# Patient Record
Sex: Male | Born: 1965 | Race: White | Hispanic: No | Marital: Married | Smoking: Former smoker
Health system: Southern US, Community
[De-identification: ages and names within clinical notes are randomized; demographics above are authoritative.]

## PROBLEM LIST (undated history)

## (undated) HISTORY — PX: BACK SURGERY: SHX140

---

## 2001-01-26 ENCOUNTER — Encounter: Payer: Self-pay | Admitting: Family Medicine

## 2001-01-26 ENCOUNTER — Ambulatory Visit (HOSPITAL_COMMUNITY): Admission: RE | Admit: 2001-01-26 | Discharge: 2001-01-26 | Payer: Self-pay | Admitting: Family Medicine

## 2001-03-06 ENCOUNTER — Ambulatory Visit (HOSPITAL_COMMUNITY): Admission: RE | Admit: 2001-03-06 | Discharge: 2001-03-06 | Payer: Self-pay | Admitting: Internal Medicine

## 2001-03-06 ENCOUNTER — Encounter: Payer: Self-pay | Admitting: Internal Medicine

## 2003-09-09 ENCOUNTER — Ambulatory Visit (HOSPITAL_COMMUNITY): Admission: RE | Admit: 2003-09-09 | Discharge: 2003-09-09 | Payer: Self-pay | Admitting: Family Medicine

## 2004-02-29 ENCOUNTER — Ambulatory Visit (HOSPITAL_COMMUNITY): Admission: RE | Admit: 2004-02-29 | Discharge: 2004-02-29 | Payer: Self-pay | Admitting: Family Medicine

## 2006-01-24 ENCOUNTER — Ambulatory Visit (HOSPITAL_COMMUNITY): Admission: RE | Admit: 2006-01-24 | Discharge: 2006-01-24 | Payer: Self-pay | Admitting: Family Medicine

## 2008-06-30 ENCOUNTER — Ambulatory Visit (HOSPITAL_COMMUNITY): Admission: RE | Admit: 2008-06-30 | Discharge: 2008-07-01 | Payer: Self-pay | Admitting: Neurological Surgery

## 2010-04-22 ENCOUNTER — Encounter: Payer: Self-pay | Admitting: Family Medicine

## 2010-07-11 LAB — BASIC METABOLIC PANEL
BUN: 16 mg/dL (ref 6–23)
Calcium: 8.9 mg/dL (ref 8.4–10.5)
Chloride: 105 mEq/L (ref 96–112)
GFR calc non Af Amer: >60 mL/min (ref >60)
Potassium: 4.2 mEq/L (ref 3.5–5.1)

## 2010-07-11 LAB — CBC
HCT: 48 % (ref 39.0–52.0)
MCHC: 35 g/dL (ref 30.0–36.0)
MCV: 93.1 fL (ref 78.0–100.0)
RBC: 5.16 MIL/uL (ref 4.22–5.81)
RDW: 12.9 % (ref 11.5–15.5)

## 2010-08-14 NOTE — Op Note (Signed)
NAME:  Ryan Montoya, Ryan Montoya               ACCOUNT NO.:  [id number]   MEDICAL RECORD NO.:  [id number]          PATIENT TYPE:  OIB   LOCATION:  3534                         FACILITY:  [facility]   PHYSICIAN:  Stefani Dama, M.D.  DATE OF BIRTH:  03-01-1966   DATE OF PROCEDURE:  06/30/2008  DATE OF DISCHARGE:                               OPERATIVE REPORT   PREOPERATIVE DIAGNOSIS:  Herniated nucleus pulposus, L4-L5 left with  left lumbar radiculopathy.   POSTOPERATIVE DIAGNOSIS:  Herniated nucleus pulposus, L4-L5 left with  left lumbar radiculopathy.   PROCEDURE:  Microdiskectomy, L4-L5 left with operating microscope  microdissection technique.   SURGEON:  Stefani Dama, MD   ANESTHESIA:  General endotracheal.   INDICATIONS:  Ryan Montoya is a 45 year old individual who has had  significant back and left lower extremity pain.  He has disk herniation  at L4-L5 on the left side and he has been advised regarding surgical  decompression.  He has some evidence of some modest weakness in the  tibialis anterior group.   PROCEDURE IN DETAIL:  The patient was brought to the operating room  supine on a stretcher.  After smooth induction of general endotracheal  anesthesia, he was turned prone.  The back was prepped with alcohol and  DuraPrep and draped in a sterile fashion.  A small vertical incision was  created in the midline of the lumbar spine across the region of the  posterior-superior iliac crest.  The dissection was carried down to the  lumbodorsal fascia which was opened on left side of midline.  Dissection  of the first interlaminar space revealed L5-S1 on a radiograph and  dissection was carried slightly cephalad to expose L4-L5.  The inferior  margin of the lamina of L5 including the medial wall of the facet was  then taken down with a high-speed drill and a 2.3-mm dissecting tool.  The superior articular process of L5 was resected partially and this  identified the area of the disk  space and the underlying dura which was  compressed dorsally stretching the L5 nerve root as it reached for the  L5 foramen and underneath this, was noted to be a significant mass with  a pearlescent thinned out piece of ligament and dissection through this  yielded a singular large fragment of disk, which when removed yielded  good relief on the L5 nerve root.  The space was explored and it was  noted be contiguous with the disk space and exposure of this yielded  substantially more markedly degenerated disk material which was removed  in a piecemeal fashion using combination of curettes, rongeurs, and  punches.  As the dissection continued from both medially and laterally,  significant quantities of disk material were removed estimating the  total at about 40% of volume of the disk space.  In the end, good  decompression of the common dural tube and the nerve root was achieved.  The disk space was emptied of all available loose fragments of disk that  could be reached via this technique.  Once this was accomplished, then  the area was  irrigated copiously with antibiotic irrigating solution.  Epidural veins were checked for hemostasis.  Once this was achieved, 1  mL of fentanyl was left in the epidural space and the disk space.  Then  the lumbodorsal fascia  was closed with #1 Vicryl, #1 Vicryl was used for subcutaneous tissues,  and 3-0 Vicryl was used in the subcuticular skin.  The patient tolerated  the procedure well and was returned to the recovery room in stable  condition.  Dermabond was placed on the skin prior to returning the  patient.      Stefani Dama, M.D.  Electronically Signed     HJE/MEDQ  D:  06/30/2008  T:  07/01/2008  Job:  [id number]

## 2013-12-22 ENCOUNTER — Other Ambulatory Visit (HOSPITAL_COMMUNITY): Payer: Self-pay | Admitting: Physician Assistant

## 2013-12-22 DIAGNOSIS — R609 Edema, unspecified: Secondary | ICD-10-CM

## 2013-12-23 ENCOUNTER — Ambulatory Visit (HOSPITAL_COMMUNITY)
Admission: RE | Admit: 2013-12-23 | Discharge: 2013-12-23 | Disposition: A | Discharge status: Home / Self Care | Payer: Managed Care, Other (non HMO) | Source: Ambulatory Visit | Attending: Physician Assistant | Admitting: Physician Assistant

## 2013-12-23 DIAGNOSIS — R609 Edema, unspecified: Secondary | ICD-10-CM

## 2013-12-23 DIAGNOSIS — F17200 Nicotine dependence, unspecified, uncomplicated: Secondary | ICD-10-CM | POA: Diagnosis not present

## 2013-12-28 ENCOUNTER — Encounter (INDEPENDENT_AMBULATORY_CARE_PROVIDER_SITE_OTHER): Payer: Self-pay | Admitting: *Deleted

## 2014-02-08 ENCOUNTER — Other Ambulatory Visit: Payer: Self-pay

## 2014-02-08 ENCOUNTER — Telehealth: Payer: Self-pay

## 2014-02-08 DIAGNOSIS — Z1211 Encounter for screening for malignant neoplasm of colon: Secondary | ICD-10-CM

## 2014-02-08 NOTE — Telephone Encounter (Signed)
Pt's wife left a VM to call to schedule screening colonoscopy. He was referred by Dr. Hilma Favors. I returned her call and Left Vm for a return call.

## 2014-02-09 NOTE — Telephone Encounter (Signed)
Gastroenterology Pre-Procedure Review  Request Date: 02/08/2014 Requesting Physician: Dr. Hilma Favors  PATIENT REVIEW QUESTIONS: The patient responded to the following health history questions as indicated:    1. Diabetes Melitis: no 2. Joint replacements in the past 12 months: no 3. Major health problems in the past 3 months: no 4. Has an artificial valve or MVP: no 5. Has a defibrillator: no 6. Has been advised in past to take antibiotics in advance of a procedure like teeth cleaning: no    MEDICATIONS & ALLERGIES:    Patient reports the following regarding taking any blood thinners:   Plavix? no Aspirin? no Coumadin? no  Patient confirms/reports the following medications:  Current Outpatient Prescriptions  Medication Sig Dispense Refill  . ALPRAZolam (XANAX) 0.5 MG tablet Take 0.5 mg by mouth 2 (two) times daily as needed for anxiety.    Marland Kitchen amLODipine (NORVASC) 5 MG tablet Take 5 mg by mouth daily.    Marland Kitchen buPROPion (WELLBUTRIN) 100 MG tablet Take 100 mg by mouth 2 (two) times daily.    . lansoprazole (PREVACID) 30 MG capsule Take 30 mg by mouth daily.    Marland Kitchen latanoprost (XALATAN) 0.005 % ophthalmic solution 1 drop at bedtime.     No current facility-administered medications for this visit.    Patient confirms/reports the following allergies:  Not on File  No orders of the defined types were placed in this encounter.    AUTHORIZATION INFORMATION Primary Insurance:   ID #:   Group #:  Pre-Cert / Auth required:  Pre-Cert / Auth #:   Secondary Insurance:   ID #:   Group #:  Pre-Cert / Auth required: Pre-Cert / Auth #:   SCHEDULE INFORMATION: Procedure has been scheduled as follows:  Date:03/18/2014               Time: 8:30 AM Location: [facility] Short Stay  This Gastroenterology Pre-Precedure Review Form is being routed to the following provider(s): Barney Drain, MD

## 2014-02-16 MED ORDER — PEG-KCL-NACL-NASULF-NA ASC-C 100 G PO SOLR: 1.0000 | ORAL | Status: DC

## 2014-02-16 NOTE — Telephone Encounter (Signed)
MOVI PREP SPLIT DOSING, REGULAR BREAKFAST. CLEAR LIQUIDS AFTER 9 AM.  WILL NEED PHENERGAN FOR CONSCIOUS SEDATION.

## 2014-02-16 NOTE — Telephone Encounter (Signed)
Rx sent to the pharmacy and instructions mailed to pt.  

## 2014-02-17 NOTE — Telephone Encounter (Signed)
Carolyn in endo is aware that pt will need phenergan on hand and she is putting note in orders.

## 2014-03-07 ENCOUNTER — Telehealth: Payer: Self-pay

## 2014-03-07 NOTE — Telephone Encounter (Signed)
REVIEWED-NO ADDITIONAL RECOMMENDATIONS. 

## 2014-03-07 NOTE — Telephone Encounter (Signed)
I called pt on Friday, 03/04/2014 to update meds prior to colonoscopy and he said there has been no change.

## 2014-03-11 ENCOUNTER — Telehealth: Payer: Self-pay

## 2014-03-11 NOTE — Telephone Encounter (Signed)
I called [employer] and spoke to [place] who said that a PA is not required for screening colonoscopy.

## 2014-03-18 ENCOUNTER — Encounter (HOSPITAL_COMMUNITY)
Admission: RE | Disposition: A | Discharge status: Home / Self Care | Payer: Self-pay | Source: Ambulatory Visit | Attending: Gastroenterology

## 2014-03-18 ENCOUNTER — Ambulatory Visit (HOSPITAL_COMMUNITY)
Admission: RE | Admit: 2014-03-18 | Discharge: 2014-03-18 | Disposition: A | Discharge status: Home / Self Care | Payer: 59 | Source: Ambulatory Visit | Attending: Gastroenterology | Admitting: Gastroenterology

## 2014-03-18 ENCOUNTER — Encounter (HOSPITAL_COMMUNITY): Payer: Self-pay | Admitting: *Deleted

## 2014-03-18 DIAGNOSIS — K621 Rectal polyp: Secondary | ICD-10-CM | POA: Diagnosis not present

## 2014-03-18 DIAGNOSIS — Z1211 Encounter for screening for malignant neoplasm of colon: Secondary | ICD-10-CM | POA: Insufficient documentation

## 2014-03-18 DIAGNOSIS — D122 Benign neoplasm of ascending colon: Secondary | ICD-10-CM

## 2014-03-18 DIAGNOSIS — K5730 Diverticulosis of large intestine without perforation or abscess without bleeding: Secondary | ICD-10-CM | POA: Insufficient documentation

## 2014-03-18 DIAGNOSIS — K648 Other hemorrhoids: Secondary | ICD-10-CM | POA: Insufficient documentation

## 2014-03-18 DIAGNOSIS — Z87891 Personal history of nicotine dependence: Secondary | ICD-10-CM | POA: Diagnosis not present

## 2014-03-18 HISTORY — PX: COLONOSCOPY: SHX5424

## 2014-03-18 SURGERY — COLONOSCOPY
Anesthesia: Moderate Sedation

## 2014-03-18 MED ORDER — MIDAZOLAM HCL 5 MG/5ML IJ SOLN
INTRAMUSCULAR | Status: AC
  Filled 2014-03-18: qty 10

## 2014-03-18 MED ORDER — PROMETHAZINE HCL 25 MG/ML IJ SOLN
INTRAMUSCULAR | Status: DC | PRN
  Administered 2014-03-18 (×2): 12.5 mg via INTRAVENOUS

## 2014-03-18 MED ORDER — PROMETHAZINE HCL 25 MG/ML IJ SOLN
INTRAMUSCULAR | Status: AC
  Filled 2014-03-18: qty 1

## 2014-03-18 MED ORDER — SIMETHICONE 40 MG/0.6ML PO SUSP
ORAL | Status: DC | PRN
  Administered 2014-03-18: 09:00:00

## 2014-03-18 MED ORDER — SODIUM CHLORIDE 0.9 % IJ SOLN
INTRAMUSCULAR | Status: AC
  Filled 2014-03-18: qty 10

## 2014-03-18 MED ORDER — MIDAZOLAM HCL 5 MG/5ML IJ SOLN
INTRAMUSCULAR | Status: DC | PRN
  Administered 2014-03-18 (×2): 2 mg via INTRAVENOUS
  Administered 2014-03-18: 1 mg via INTRAVENOUS
  Administered 2014-03-18: 2 mg via INTRAVENOUS

## 2014-03-18 MED ORDER — MEPERIDINE HCL 100 MG/ML IJ SOLN
INTRAMUSCULAR | Status: AC
  Filled 2014-03-18: qty 2

## 2014-03-18 MED ORDER — MEPERIDINE HCL 100 MG/ML IJ SOLN
INTRAMUSCULAR | Status: DC | PRN
  Administered 2014-03-18: 50 mg via INTRAVENOUS
  Administered 2014-03-18: 25 mg via INTRAVENOUS
  Administered 2014-03-18: 50 mg via INTRAVENOUS

## 2014-03-18 NOTE — Op Note (Signed)
[name], [address]   COLONOSCOPY PROCEDURE REPORT  PATIENT: Ryan Montoya, Ryan Montoya  MR#: [id number] BIRTHDATE: 1965-09-07 , 48  yrs. old GENDER: male ENDOSCOPIST: Barney Drain, MD REFERRED BY: PROCEDURE DATE:  April 04, 2014 PROCEDURE:   Colonoscopy with snare polypectomy and Colonoscopy with cold biopsy polypectomy INDICATIONS:average risk for colon cancer. MEDICATIONS: Demerol 125 mg IV, Promethazine (Phenergan) 25 mg IV, and Versed 7 mg IV  DESCRIPTION OF PROCEDURE:    Physical exam was performed.  Informed consent was obtained from the patient after explaining the benefits, risks, and alternatives to procedure.  The patient was connected to monitor and placed in left lateral position. Continuous oxygen was provided by nasal cannula and IV medicine administered through an indwelling cannula.  After administration of sedation and rectal exam, the patients rectum was intubated and the EC-3890Li ([id number])  colonoscope was advanced under direct visualization to the cecum.  The scope was removed slowly by carefully examining the color, texture, anatomy, and integrity mucosa on the way out.  The patient was recovered in endoscopy and discharged home in satisfactory condition.    COLON FINDINGS: A sessile polyp measuring 6 mm in size was found in the ascending colon.  A polypectomy was performed using snare cautery.  , A sessile polyp measuring 3 mm in size was found in the rectum.  A polypectomy was performed with cold forceps.  , There was moderate diverticulosis noted throughout the entire examined colon with associated muscular hypertrophy.  , and Moderate sized internal hemorrhoids were found.  PREP QUALITY: good.  CECAL W/D TIME: 37.1  MINS   COMPLICATIONS: None  ENDOSCOPIC IMPRESSION: 1.   TWO COLON POLYPS REMOVED 2.   Moderate diverticulosis throughout the entire examined colon 3.   Moderate sized internal  hemorrhoids  RECOMMENDATIONS: FOLLOW A HIGH FIBER DIET.  AVOID ITEMS THAT CAUSE BLOATING & GAS. AWAIT BIOPSY Next colonoscopy in 5-10 years.    _______________________________ Lorrin MaisBarney Drain, MD Apr 04, 2014 1:15 PM    CPT CODES: ICD CODES:  The ICD and CPT codes recommended by this software are interpretations from the data that the clinical staff has captured with the software.  The verification of the translation of this report to the ICD and CPT codes and modifiers is the sole responsibility of the health care institution and practicing physician where this report was generated.  [employer] will not be held responsible for the validity of the ICD and CPT codes included on this report.  AMA assumes no liability for data contained or not contained herein. CPT is a Designer, television/film set of the [employer].

## 2014-03-18 NOTE — Discharge Instructions (Signed)
You had 2 polyps removed. You have internal hemorrhoids. YOU HAVE DIVERTICULOSIS IN YOUR RIGHT AND LEFT COLON.   FOLLOW A HIGH FIBER DIET. AVOID ITEMS THAT CAUSE BLOATING & GAS. SEE INFO BELOW.  YOUR BIOPSY WILL BE BACK IN 14 DAYS OR YOU CAN LOOK THEM UP ON MY CHART AFTER DEC 22.  Next colonoscopy in 5-10 years.   Colonoscopy Care After Read the instructions outlined below and refer to this sheet in the next week. These discharge instructions provide you with general information on caring for yourself after you leave the hospital. While your treatment has been planned according to the most current medical practices available, unavoidable complications occasionally occur. If you have any problems or questions after discharge, call DR. Jaykob Minichiello, [phone].  ACTIVITY  You may resume your regular activity, but move at a slower pace for the next 24 hours.   Take frequent rest periods for the next 24 hours.   Walking will help get rid of the air and reduce the bloated feeling in your belly (abdomen).   No driving for 24 hours (because of the medicine (anesthesia) used during the test).   You may shower.   Do not sign any important legal documents or operate any machinery for 24 hours (because of the anesthesia used during the test).    NUTRITION  Drink plenty of fluids.   You may resume your normal diet as instructed by your doctor.   Begin with a light meal and progress to your normal diet. Heavy or fried foods are harder to digest and may make you feel sick to your stomach (nauseated).   Avoid alcoholic beverages for 24 hours or as instructed.    MEDICATIONS  You may resume your normal medications.   WHAT YOU CAN EXPECT TODAY  Some feelings of bloating in the abdomen.   Passage of more gas than usual.   Spotting of blood in your stool or on the toilet paper  .  IF YOU HAD POLYPS REMOVED DURING THE COLONOSCOPY:  Eat a soft diet IF YOU HAVE NAUSEA, BLOATING,  ABDOMINAL PAIN, OR VOMITING.    FINDING OUT THE RESULTS OF YOUR TEST Not all test results are available during your visit. DR. Oneida Alar WILL CALL YOU WITHIN 14 DAYS OF YOUR PROCEDUE WITH YOUR RESULTS. Do not assume everything is normal if you have not heard from DR. Princessa Lesmeister, CALL HER OFFICE AT [phone].  SEEK IMMEDIATE MEDICAL ATTENTION AND CALL THE OFFICE: [phone] IF:  You have more than a spotting of blood in your stool.   Your belly is swollen (abdominal distention).   You are nauseated or vomiting.   You have a temperature over 101F.   You have abdominal pain or discomfort that is severe or gets worse throughout the day.   High-Fiber Diet A high-fiber diet changes your normal diet to include more whole grains, legumes, fruits, and vegetables. Changes in the diet involve replacing refined carbohydrates with unrefined foods. The calorie level of the diet is essentially unchanged. The Dietary Reference Intake (recommended amount) for adult males is 38 grams per day. For adult females, it is 25 grams per day. Pregnant and lactating women should consume 28 grams of fiber per day. Fiber is the intact part of a plant that is not broken down during digestion. Functional fiber is fiber that has been isolated from the plant to provide a beneficial effect in the body. PURPOSE  Increase stool bulk.   Ease and regulate bowel movements.   Lower  cholesterol.  INDICATIONS THAT YOU NEED MORE FIBER  Constipation and hemorrhoids.   Uncomplicated diverticulosis (intestine condition) and irritable bowel syndrome.   Weight management.   As a protective measure against hardening of the arteries (atherosclerosis), diabetes, and cancer.   GUIDELINES FOR INCREASING FIBER IN THE DIET  Start adding fiber to the diet slowly. A gradual increase of about 5 more grams (2 slices of whole-wheat bread, 2 servings of most fruits or vegetables, or 1 bowl of high-fiber cereal) per day is best. Too rapid  an increase in fiber may result in constipation, flatulence, and bloating.   Drink enough water and fluids to keep your urine clear or pale yellow. Water, juice, or caffeine-free drinks are recommended. Not drinking enough fluid may cause constipation.   Eat a variety of high-fiber foods rather than one type of fiber.   Try to increase your intake of fiber through using high-fiber foods rather than fiber pills or supplements that contain small amounts of fiber.   The goal is to change the types of food eaten. Do not supplement your present diet with high-fiber foods, but replace foods in your present diet.  INCLUDE A VARIETY OF FIBER SOURCES  Replace refined and processed grains with whole grains, canned fruits with fresh fruits, and incorporate other fiber sources. White rice, white breads, and most bakery goods contain little or no fiber.   Brown whole-grain rice, buckwheat oats, and many fruits and vegetables are all good sources of fiber. These include: broccoli, Brussels sprouts, cabbage, cauliflower, beets, sweet potatoes, white potatoes (skin on), carrots, tomatoes, eggplant, squash, berries, fresh fruits, and dried fruits.   Cereals appear to be the richest source of fiber. Cereal fiber is found in whole grains and bran. Bran is the fiber-rich outer coat of cereal grain, which is largely removed in refining. In whole-grain cereals, the bran remains. In breakfast cereals, the largest amount of fiber is found in those with "bran" in their names. The fiber content is sometimes indicated on the label.   You may need to include additional fruits and vegetables each day.   In baking, for 1 cup white flour, you may use the following substitutions:   1 cup whole-wheat flour minus 2 tablespoons.   1/2 cup white flour plus 1/2 cup whole-wheat flour.   Diverticulosis Diverticulosis is a common condition that develops when small pouches (diverticula) form in the wall of the colon. The risk of  diverticulosis increases with age. It happens more often in people who eat a low-fiber diet. Most individuals with diverticulosis have no symptoms. Those individuals with symptoms usually experience belly (abdominal) pain, constipation, or loose stools (diarrhea).  HOME CARE INSTRUCTIONS  Increase the amount of fiber in your diet as directed by your caregiver or dietician. This may reduce symptoms of diverticulosis.   Drink at least 6 to 8 glasses of water each day to prevent constipation.   Try not to strain when you have a bowel movement.   NO NEED TO AVOID nuts and seeds.   FOODS HAVING HIGH FIBER CONTENT INCLUDE:  Fruits. Apple, peach, pear, tangerine, raisins, prunes.   Vegetables. Brussels sprouts, asparagus, broccoli, cabbage, carrot, cauliflower, romaine lettuce, spinach, summer squash, tomato, winter squash, zucchini.   Starchy Vegetables. Baked beans, kidney beans, lima beans, split peas, lentils, potatoes (with skin).   Grains. Whole wheat bread, brown rice, bran flake cereal, plain oatmeal, white rice, shredded wheat, bran muffins.    SEEK IMMEDIATE MEDICAL CARE IF:  You develop increasing  pain or severe bloating.   You have an oral temperature above 101F.   You develop vomiting or bowel movements that are bloody or black.    Polyps, Colon  A polyp is extra tissue that grows inside your body. Colon polyps grow in the large intestine. The large intestine, also called the colon, is part of your digestive system. It is a long, hollow tube at the end of your digestive tract where your body makes and stores stool. Most polyps are not dangerous. They are benign. This means they are not cancerous. But over time, some types of polyps can turn into cancer. Polyps that are smaller than a pea are usually not harmful. But larger polyps could someday become or may already be cancerous. To be safe, doctors remove all polyps and test them.   WHO GETS POLYPS? Anyone can get polyps,  but certain people are more likely than others. You may have a greater chance of getting polyps if:  You are over 50.   You have had polyps before.   Someone in your family has had polyps.   Someone in your family has had cancer of the large intestine.   Find out if someone in your family has had polyps. You may also be more likely to get polyps if you:   Eat a lot of fatty foods   Smoke   Drink alcohol   Do not exercise  Eat too much   TREATMENT  The caregiver will remove the polyp during sigmoidoscopy or colonoscopy.    PREVENTION There is not one sure way to prevent polyps. You might be able to lower your risk of getting them if you:  Eat more fruits and vegetables and less fatty food.   Do not smoke.   Avoid alcohol.   Exercise every day.   Lose weight if you are overweight.   Eating more calcium and folate can also lower your risk of getting polyps. Some foods that are rich in calcium are milk, cheese, and broccoli. Some foods that are rich in folate are chickpeas, kidney beans, and spinach.   Hemorrhoids Hemorrhoids are dilated (enlarged) veins around the rectum. Sometimes clots will form in the veins. This makes them swollen and painful. These are called thrombosed hemorrhoids. Causes of hemorrhoids include:  Constipation.   Straining to have a bowel movement.   HEAVY LIFTING HOME CARE INSTRUCTIONS  Eat a well balanced diet and drink 6 to 8 glasses of water every day to avoid constipation. You may also use a bulk laxative.   Avoid straining to have bowel movements.   Keep anal area dry and clean.   Do not use a donut shaped pillow or sit on the toilet for long periods. This increases blood pooling and pain.   Move your bowels when your body has the urge; this will require less straining and will decrease pain and pressure.

## 2014-03-18 NOTE — H&P (Signed)
  Primary Care Physician:  Collene Mares, PA-C Primary Gastroenterologist:  Dr. Oneida Alar  Pre-Procedure History & Physical: HPI:  Ryan Montoya is a 48 y.o. male here for [place].  History reviewed. No pertinent past medical history.  Past Surgical History  Procedure Laterality Date  . Back surgery      Prior to Admission medications   Medication Sig Start Date End Date Taking? Authorizing Provider  ALPRAZolam Duanne Moron) 0.5 MG tablet Take 0.5 mg by mouth 2 (two) times daily as needed for anxiety.   Yes Historical Provider, MD  amLODipine (NORVASC) 5 MG tablet Take 5 mg by mouth daily.   Yes Historical Provider, MD  buPROPion (WELLBUTRIN) 100 MG tablet Take 100 mg by mouth 2 (two) times daily.   Yes Historical Provider, MD  lansoprazole (PREVACID) 30 MG capsule Take 30 mg by mouth daily.   Yes Historical Provider, MD  latanoprost (XALATAN) 0.005 % ophthalmic solution 1 drop at bedtime.   Yes Historical Provider, MD  peg 3350 powder (MOVIPREP) 100 G SOLR Take 1 kit (200 g total) by mouth as directed. 02/16/14  Yes Danie Binder, MD    Allergies as of 02/08/2014  . (Not on File)    Family History  Problem Relation Age of Onset  . Heart attack Mother   . Colon cancer Mother     History   Social History  . Marital Status: Married    Spouse Name: N/A    Number of Children: N/A  . Years of Education: N/A   Occupational History  . Not on file.   Social History Main Topics  . Smoking status: Former Smoker -- 1.50 packs/day for 5 years    Quit date: 03/19/1999  . Smokeless tobacco: Never Used  . Alcohol Use: No  . Drug Use: No  . Sexual Activity: Yes   Other Topics Concern  . Not on file   Social History Narrative  . No narrative on file    Review of Systems: See HPI, otherwise negative ROS   Physical Exam: BP 158/100 mmHg  Pulse 84  Temp(Src) 97.9 F (36.6 C) (Oral)  Resp 16  Ht _0  (1.702 m)  Wt 186 lb (84.369 kg)  BMI 29.12 kg/m2  SpO2  98% General:   Alert,  pleasant and cooperative in NAD Head:  Normocephalic and atraumatic. Neck:  Supple; Lungs:  Clear throughout to auscultation.    Heart:  Regular rate and rhythm. Abdomen:  Soft, nontender and nondistended. Normal bowel sounds, without guarding, and without rebound.   Neurologic:  Alert and  oriented x4;  grossly normal neurologically.  Impression/Plan:     SCREENING  Plan:  1. TCS TODAY

## 2014-03-21 ENCOUNTER — Encounter (HOSPITAL_COMMUNITY): Payer: Self-pay | Admitting: Gastroenterology

## 2014-05-04 ENCOUNTER — Telehealth: Payer: Self-pay | Admitting: Gastroenterology

## 2014-05-04 NOTE — Telephone Encounter (Signed)
Please call pt. He had ONE simple adenoma removed. THE OTHER WAS A BENIGN POLYPOID LESION.    FOLLOW A HIGH FIBER DIET. AVOID ITEMS THAT CAUSE BLOATING & GAS.  Next colonoscopy in 5-10 years WITH PROPOFOL BECAUSE IT TOOK HIGH DOSES OF MEDICATION TO GET HIM SLEEPY.

## 2014-05-04 NOTE — Telephone Encounter (Signed)
LM for pt to call

## 2014-05-04 NOTE — Telephone Encounter (Signed)
REMINDER IN EPIC °

## 2014-05-05 ENCOUNTER — Telehealth: Payer: Self-pay | Admitting: Gastroenterology

## 2014-05-05 NOTE — Telephone Encounter (Signed)
Pt returned call and was informed.  

## 2014-05-05 NOTE — Telephone Encounter (Signed)
Patient returned call and will call back because he is at work and difficult for him to get to the phone.

## 2014-05-05 NOTE — Telephone Encounter (Signed)
Pt to call back, he is at work.

## 2014-05-05 NOTE — Telephone Encounter (Signed)
Noted  

## 2014-05-13 ENCOUNTER — Ambulatory Visit (HOSPITAL_COMMUNITY)
Admission: RE | Admit: 2014-05-13 | Discharge: 2014-05-13 | Disposition: A | Discharge status: Home / Self Care | Payer: Managed Care, Other (non HMO) | Source: Ambulatory Visit | Attending: Internal Medicine | Admitting: Internal Medicine

## 2014-05-13 ENCOUNTER — Other Ambulatory Visit (HOSPITAL_COMMUNITY): Payer: Self-pay | Admitting: Internal Medicine

## 2014-05-13 DIAGNOSIS — R059 Cough, unspecified: Secondary | ICD-10-CM

## 2014-05-13 DIAGNOSIS — R918 Other nonspecific abnormal finding of lung field: Secondary | ICD-10-CM | POA: Insufficient documentation

## 2014-05-13 DIAGNOSIS — R05 Cough: Secondary | ICD-10-CM | POA: Diagnosis present

## 2018-10-19 ENCOUNTER — Other Ambulatory Visit: Payer: Self-pay

## 2018-10-19 ENCOUNTER — Ambulatory Visit (HOSPITAL_COMMUNITY)
Admission: RE | Admit: 2018-10-19 | Discharge: 2018-10-19 | Disposition: A | Discharge status: Home / Self Care | Payer: Managed Care, Other (non HMO) | Source: Ambulatory Visit | Attending: Internal Medicine | Admitting: Internal Medicine

## 2018-10-19 ENCOUNTER — Other Ambulatory Visit (HOSPITAL_COMMUNITY): Payer: Self-pay | Admitting: Internal Medicine

## 2018-10-19 DIAGNOSIS — M79671 Pain in right foot: Secondary | ICD-10-CM

## 2019-03-16 ENCOUNTER — Encounter: Payer: Self-pay | Admitting: Gastroenterology

## 2020-04-29 IMAGING — DX RIGHT FOOT COMPLETE - 3+ VIEW
3 series · 3 of 3 positions shown · non-contrast
Comparison: None.

CLINICAL DATA: Right foot pain for the past 4 months with plantar
soft tissue swelling. No known injury.

EXAM:
RIGHT FOOT COMPLETE - 3+ VIEW

[foot ap]
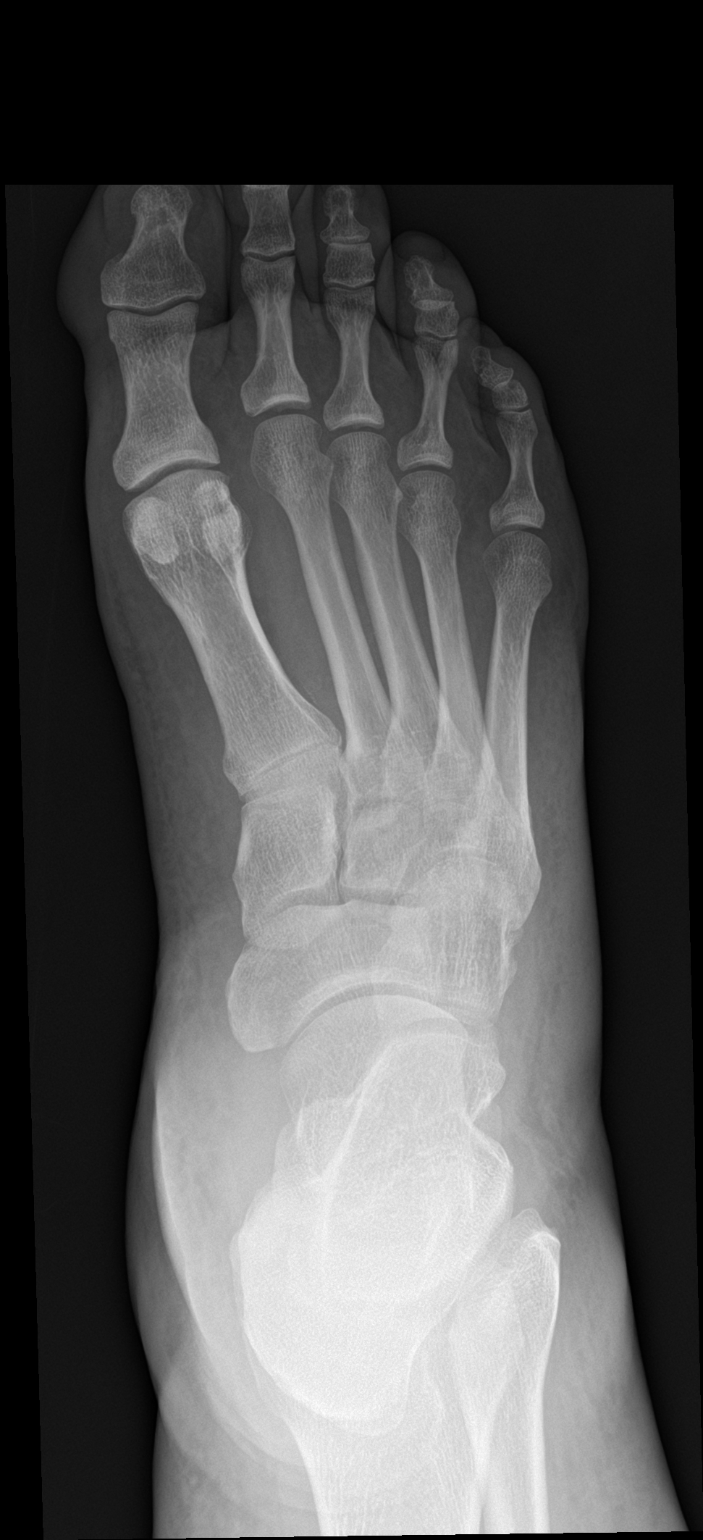

[foot obl]
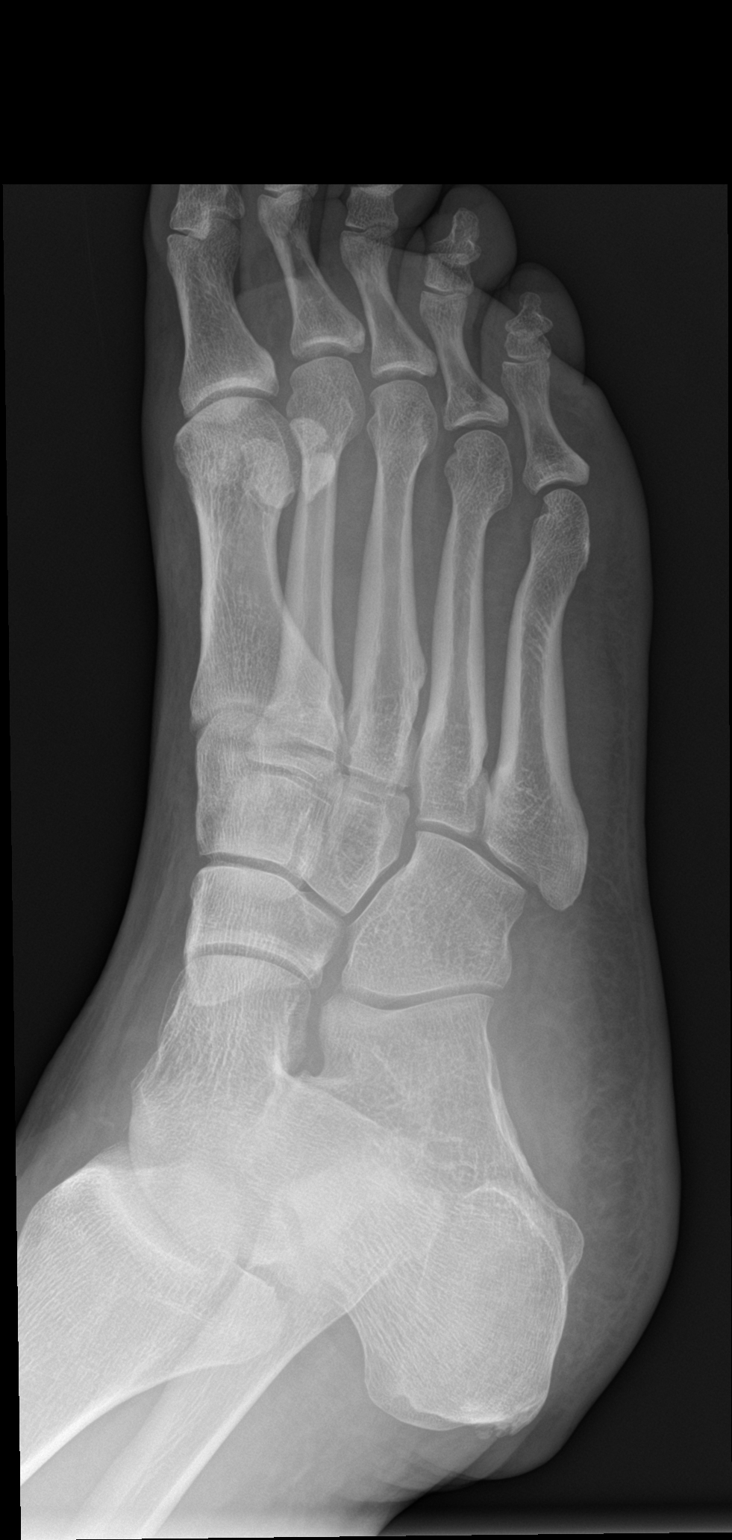

[foot lat]
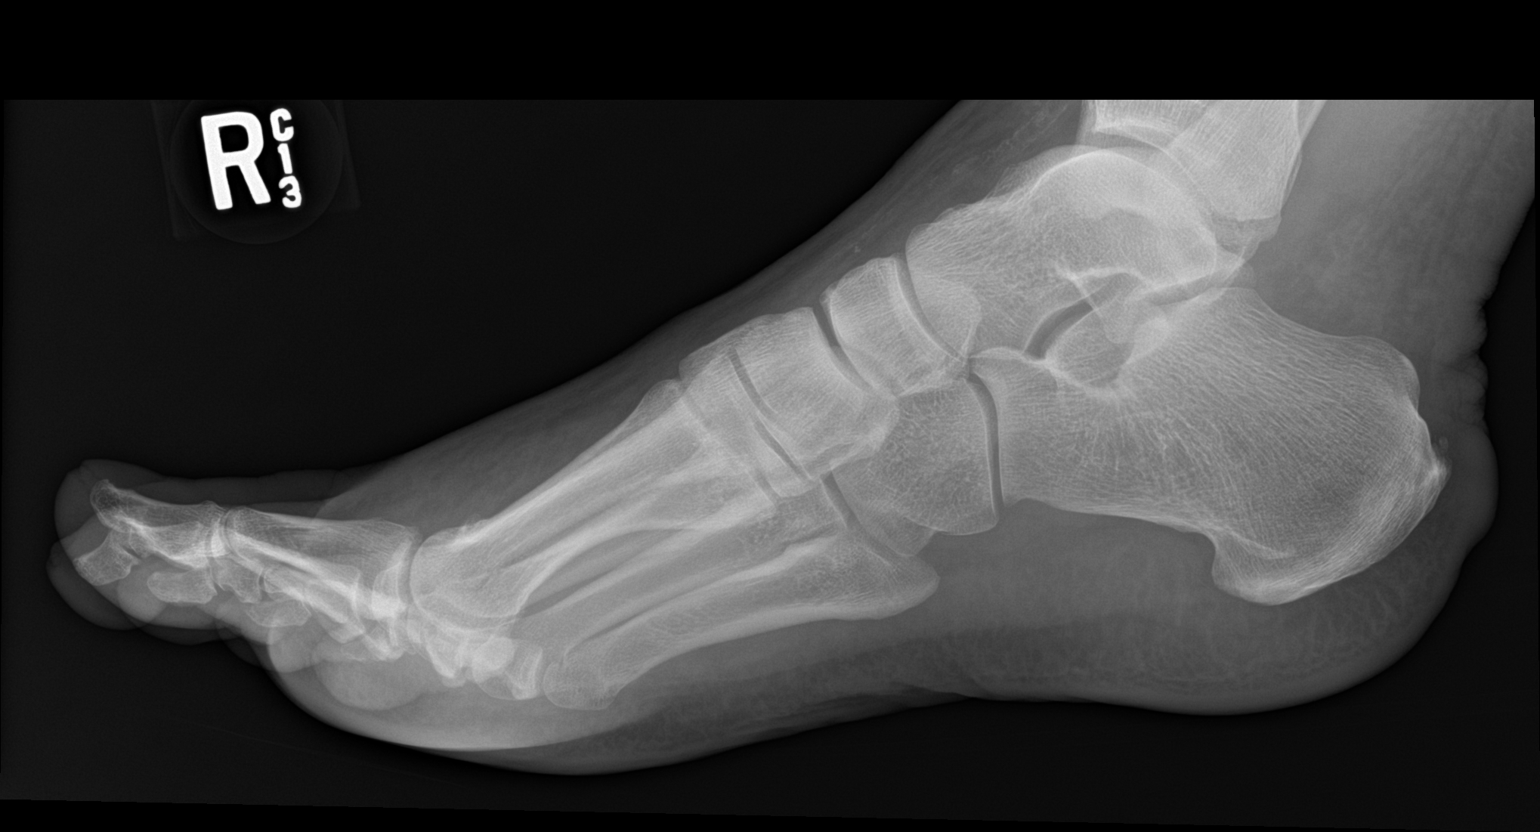

[3 of 3 positions shown; findings below may reference images not displayed]

FINDINGS: Mild posterior calcaneal spur formation. Minimal anterior talotibial
spur formation. Otherwise, unremarkable bones. Arterial atheromatous
calcifications.
IMPRESSION: Minimal degenerative changes. No acute abnormality.

## 2020-06-28 ENCOUNTER — Encounter: Payer: Self-pay | Admitting: Orthopaedic Surgery

## 2020-06-28 ENCOUNTER — Other Ambulatory Visit: Payer: Self-pay

## 2020-06-28 ENCOUNTER — Ambulatory Visit (INDEPENDENT_AMBULATORY_CARE_PROVIDER_SITE_OTHER): Payer: Managed Care, Other (non HMO) | Admitting: Orthopaedic Surgery

## 2020-06-28 ENCOUNTER — Ambulatory Visit (INDEPENDENT_AMBULATORY_CARE_PROVIDER_SITE_OTHER): Payer: Managed Care, Other (non HMO)

## 2020-06-28 VITALS — Ht 67.0 in | Wt 210.0 lb

## 2020-06-28 DIAGNOSIS — M79672 Pain in left foot: Secondary | ICD-10-CM | POA: Diagnosis not present

## 2020-06-28 DIAGNOSIS — M722 Plantar fascial fibromatosis: Secondary | ICD-10-CM | POA: Diagnosis not present

## 2020-06-28 MED ORDER — LIDOCAINE HCL 1 % IJ SOLN
1.0000 mL | INTRAMUSCULAR | Status: AC | PRN
  Administered 2020-06-28: 1 mL

## 2020-06-28 MED ORDER — BUPIVACAINE HCL 0.25 % IJ SOLN
0.3300 mL | INTRAMUSCULAR | Status: AC | PRN
Start: 2020-06-28 — End: 2020-06-28
  Administered 2020-06-28: .33 mL

## 2020-06-28 NOTE — Progress Notes (Signed)
Office Visit Note   Patient: Ryan Montoya           Date of Birth: November 19, 1965           MRN: [id number] Visit Date: 06/28/2020              Requested by: Cory Munch, [place],  [place] [id number] PCP: Cory Munch, PA-C   Assessment & Plan: Visit Diagnoses:  1. Pain in left foot   2. Plantar fasciitis of left foot     Plan: Plantar fasciitis left foot.  Having trouble for several months without relief with shoeing and over-the-counter medicines.  Will inject with cortisone and monitor response  Follow-Up Instructions: Return if symptoms worsen or fail to improve.   Orders:  Orders Placed This Encounter  Procedures  . Foot Inj  . XR Foot 2 Views Left   No orders of the defined types were placed in this encounter.     Procedures: Foot Inj  Date/Time: 06/28/2020 10:45 AM Performed by: Garald Balding, MD Authorized by: Garald Balding, MD   Condition: Plantar Fasciitis   Location: left plantar fascia muscle   Medications:  1 mL lidocaine 1 %; 0.33 mL bupivacaine 0.25 %  6 mg betamethasone injected with Xylocaine and Marcaine into the area of tenderness along the medial aspect of the left os calcis plantarly     Clinical Data: No additional findings.   Subjective: Chief Complaint  Patient presents with  . Left Foot - Pain  Patient presents today for left foot pain. He state that it started about 1 month ago. No known injury. His pain is located in the bottom of his heel. He has pain with walking, but feels better with rest. He has been taking a lot of Goodys lately to help with the pain.   HPI  Review of Systems   Objective: Vital Signs: Ht 5\' 7"  (1.702 m)   Wt 210 lb (95.3 kg)   BMI 32.89 kg/m   Physical Exam Constitutional:      Appearance: He is well-developed.  Eyes:     Pupils: Pupils are equal, round, and reactive to light.  Pulmonary:     Effort: Pulmonary effort is normal.  Skin:    General:  Skin is warm and dry.  Neurological:     Mental Status: He is alert and oriented to person, place, and time.  Psychiatric:        Behavior: Behavior normal.     Ortho Exam left foot with local tenderness over the plantar aspect of the os calcis medially.  No arch pain.  There is some mild swelling of the ankle but he notes it is chronic.  Good capillary refill to toes.  Sensory exam intact.  No ankle pain  Specialty Comments:  No specialty comments available.  Imaging: XR Foot 2 Views Left  Result Date: 06/28/2020 Films of the left foot were obtained in several projections.  There is a very small plantar heel spur.  Patient is having symptoms consistent with plantar fasciitis.  No acute changes.    PMFS History: Patient Active Problem List   Diagnosis Date Noted  . Plantar fasciitis of left foot 06/28/2020   History reviewed. No pertinent past medical history.  Family History  Problem Relation Age of Onset  . Heart attack Mother   . Colon cancer Mother     Past Surgical History:  Procedure Laterality Date  . BACK  SURGERY    . COLONOSCOPY N/A 03/18/2014   Procedure: COLONOSCOPY;  Surgeon: Danie Binder, MD;  Location: [facility];  Service: Endoscopy;  Laterality: N/A;  8:30 AM   Social History   Occupational History  . Not on file  Tobacco Use  . Smoking status: Former Smoker    Packs/day: 1.50    Years: 5.00    Pack years: 7.50    Quit date: 03/19/1999    Years since quitting: 21.2  . Smokeless tobacco: Never Used  Substance and Sexual Activity  . Alcohol use: No  . Drug use: No  . Sexual activity: Yes

## 2022-01-25 ENCOUNTER — Encounter: Payer: Self-pay | Admitting: *Deleted

## 2022-07-01 ENCOUNTER — Encounter: Payer: Self-pay | Admitting: *Deleted

## 2023-01-27 ENCOUNTER — Other Ambulatory Visit (HOSPITAL_COMMUNITY): Payer: Self-pay | Admitting: Internal Medicine

## 2023-01-27 DIAGNOSIS — H8110 Benign paroxysmal vertigo, unspecified ear: Secondary | ICD-10-CM

## 2023-10-21 ENCOUNTER — Other Ambulatory Visit (HOSPITAL_COMMUNITY): Payer: Self-pay | Admitting: Internal Medicine

## 2023-10-21 ENCOUNTER — Ambulatory Visit (HOSPITAL_COMMUNITY)
Admission: RE | Admit: 2023-10-21 | Discharge: 2023-10-21 | Disposition: A | Discharge status: Home / Self Care | Source: Ambulatory Visit | Attending: Internal Medicine | Admitting: Internal Medicine

## 2023-10-21 DIAGNOSIS — M545 Low back pain, unspecified: Secondary | ICD-10-CM | POA: Insufficient documentation

## 2024-01-07 ENCOUNTER — Telehealth: Payer: Self-pay

## 2024-01-07 NOTE — Telephone Encounter (Signed)
 This RN attempted to contact patient directly, since Ryan Montoya is not listed on DPR. No answer. LVM.  Copied from CRM #[id number]. Topic: Clinical - Medication Question >> Jan 07, 2024  9:48 AM Shanda MATSU wrote: Reason for CRM: Patient's friend, Ryan Montoya, called in to see if there is any way for patient's medication can be refilled even though patient is not scheduled to be seen as a new patient until 05/2024. Caller is unsure what meds exactly need to be refilled, caller is req that patient be contacted to adv if this can be done and to confirm what meds need to be refilled as he is unsure.

## 2024-01-09 NOTE — Telephone Encounter (Signed)
 Patient called and advised until he's established with a new provider that provider will not be able to refill his medications. Advised [facility] or [facility] if he has transportation to [place] next week. He says he will talk with his friend [place] to see which one he can do. Advised to call us  back if he chooses the [facility] so he can be provided with the location/hours. He verbalized understanding.    Copied from CRM [id number]. Topic: Clinical - Medication Question >> Jan 09, 2024  8:57 AM Treva T wrote: Patient can be reached at the following phone number, [phone]. [place]athe[place])

## 2024-05-11 ENCOUNTER — Ambulatory Visit: Admitting: Physician Assistant
